# Patient Record
Sex: Male | Born: 2000 | Race: White | Hispanic: No | Marital: Single | Smoking: Never smoker
Health system: Southern US, Community
[De-identification: ages and names within clinical notes are randomized; demographics above are authoritative.]

## PROBLEM LIST (undated history)

## (undated) DIAGNOSIS — S42031A Displaced fracture of lateral end of right clavicle, initial encounter for closed fracture: Secondary | ICD-10-CM

## (undated) DIAGNOSIS — T7840XA Allergy, unspecified, initial encounter: Secondary | ICD-10-CM

## (undated) HISTORY — PX: TONSILLECTOMY: SUR1361

## (undated) HISTORY — PX: ADENOIDECTOMY: SUR15

---
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
## 2022-08-27 ENCOUNTER — Other Ambulatory Visit: Payer: Self-pay | Admitting: Physician Assistant

## 2022-08-27 DIAGNOSIS — Z1152 Encounter for screening for COVID-19: Secondary | ICD-10-CM

## 2022-08-27 LAB — POCT INFLUENZA A/B
Influenza A, POC: NEGATIVE
Influenza B, POC: NEGATIVE

## 2022-08-27 LAB — POC COVID19 BINAXNOW: SARS Coronavirus 2 Ag: NEGATIVE

## 2022-08-27 MED ORDER — PROMETHAZINE-DM 6.25-15 MG/5ML PO SYRP: 5.0000 mL | ORAL_SOLUTION | Freq: Four times a day (QID) | ORAL | 0 refills | Status: DC | PRN

## 2022-08-27 MED ORDER — IBUPROFEN 600 MG PO TABS: 600.0000 mg | ORAL_TABLET | Freq: Three times a day (TID) | ORAL | 0 refills | Status: DC | PRN

## 2022-08-27 NOTE — Progress Notes (Signed)
   Subjective: Cough and congestion    Patient ID: Eugene Elliott, male    DOB: January 05, 2001, 21 y.o.   MRN: [id number]  HPI Patient complaining of 2 days of cough, nasal congestion, and bodyaches.  Denies recent travel or known contact with COVID-19.  Patient tested negative for COVID-19 and influenza prior to evaluation.   Review of Systems Negative except for chief complaint    Objective:   Physical Exam  Deferred due to this being a telephonic encounter      Assessment & Plan: Upper respiratory infection   Advised patient is negative for COVID-19 or influenza.  Complaints consistent with upper respiratory infection.  Patient given a prescription for Phenergan DM and ibuprofen.  Vies to follow-up with no improvement or worsening of complaint.

## 2022-08-27 NOTE — Progress Notes (Signed)
Pt presents today with achy, coughing up thick mucus, fatigue, fever, chills, night sweats, & mucus is dark green 5 days. Past three night have been night sweats.

## 2022-11-07 ENCOUNTER — Ambulatory Visit: Payer: Self-pay

## 2022-11-07 DIAGNOSIS — Z0000 Encounter for general adult medical examination without abnormal findings: Secondary | ICD-10-CM

## 2022-11-07 LAB — POCT URINALYSIS DIPSTICK
Bilirubin, UA: NEGATIVE
Blood, UA: NEGATIVE
Glucose, UA: NEGATIVE
Ketones, UA: NEGATIVE
Leukocytes, UA: NEGATIVE
Nitrite, UA: NEGATIVE
Protein, UA: NEGATIVE
Spec Grav, UA: >=1.03 — AB (ref 1.010–1.025)
Urobilinogen, UA: 0.2 E.U./dL
pH, UA: 6 (ref 5.0–8.0)

## 2022-11-07 NOTE — Progress Notes (Signed)
Pt presents today for lap portion of physical./CL,RMA

## 2022-11-08 LAB — CMP12+LP+TP+TSH+6AC+CBC/D/PLT
ALT: 22 IU/L (ref 0–44)
AST: 26 IU/L (ref 0–40)
Albumin/Globulin Ratio: 2.6 — ABNORMAL HIGH (ref 1.2–2.2)
Albumin: 4.6 g/dL (ref 4.3–5.2)
Alkaline Phosphatase: 116 IU/L (ref 51–125)
BUN/Creatinine Ratio: 11 (ref 9–20)
BUN: 9 mg/dL (ref 6–20)
Basophils Absolute: 0 10*3/uL (ref 0.0–0.2)
Basos: 0 %
Bilirubin Total: 0.6 mg/dL (ref 0.0–1.2)
Calcium: 9.1 mg/dL (ref 8.7–10.2)
Chloride: 105 mmol/L (ref 96–106)
Chol/HDL Ratio: 3.7 ratio (ref 0.0–5.0)
Cholesterol, Total: 177 mg/dL — ABNORMAL HIGH (ref 100–169)
Creatinine, Ser: 0.82 mg/dL (ref 0.76–1.27)
EOS (ABSOLUTE): 0.6 10*3/uL — ABNORMAL HIGH (ref 0.0–0.4)
Eos: 12 %
Estimated CHD Risk: 0.6 times avg.
Free Thyroxine Index: 1.4 (ref 1.2–4.9)
GGT: 18 IU/L (ref 0–65)
Globulin, Total: 1.8 g/dL (ref 1.5–4.5)
Glucose: 93 mg/dL (ref 70–99)
HDL: 48 mg/dL (ref >39)
Hematocrit: 43.3 % (ref 37.5–51.0)
Hemoglobin: 14.9 g/dL (ref 13.0–17.7)
Immature Grans (Abs): 0 10*3/uL (ref 0.0–0.1)
Immature Granulocytes: 0 %
Iron: 108 ug/dL (ref 38–169)
LDH: 205 IU/L (ref 121–224)
LDL Chol Calc (NIH): 114 mg/dL — ABNORMAL HIGH (ref 0–109)
Lymphocytes Absolute: 2 10*3/uL (ref 0.7–3.1)
Lymphs: 43 %
MCH: 29 pg (ref 26.6–33.0)
MCHC: 34.4 g/dL (ref 31.5–35.7)
MCV: 84 fL (ref 79–97)
Monocytes Absolute: 0.3 10*3/uL (ref 0.1–0.9)
Monocytes: 6 %
Neutrophils Absolute: 1.8 10*3/uL (ref 1.4–7.0)
Neutrophils: 39 %
Phosphorus: 4 mg/dL (ref 3.4–5.5)
Platelets: 199 10*3/uL (ref 150–450)
Potassium: 4.1 mmol/L (ref 3.5–5.2)
RBC: 5.13 x10E6/uL (ref 4.14–5.80)
RDW: 12.3 % (ref 11.6–15.4)
Sodium: 140 mmol/L (ref 134–144)
T3 Uptake Ratio: 28 % (ref 24–39)
T4, Total: 4.9 ug/dL (ref 4.5–12.0)
TSH: 1.31 u[IU]/mL (ref 0.450–4.500)
Total Protein: 6.4 g/dL (ref 6.0–8.5)
Triglycerides: 83 mg/dL (ref 0–89)
Uric Acid: 5.4 mg/dL (ref 3.8–8.4)
VLDL Cholesterol Cal: 15 mg/dL (ref 5–40)
WBC: 4.7 10*3/uL (ref 3.4–10.8)
eGFR: 130 mL/min/{1.73_m2} (ref >59)

## 2022-11-11 ENCOUNTER — Encounter: Payer: Self-pay | Admitting: Physician Assistant

## 2022-11-11 ENCOUNTER — Ambulatory Visit: Payer: Self-pay | Admitting: Physician Assistant

## 2022-11-11 VITALS — BP 116/79 | HR 95 | Temp 97.6°F | Resp 12 | Ht 66.0 in | Wt 179.0 lb

## 2022-11-11 DIAGNOSIS — Z0000 Encounter for general adult medical examination without abnormal findings: Secondary | ICD-10-CM

## 2022-11-11 NOTE — Progress Notes (Signed)
   Established Patient Office Visit  Subjective   Patient ID: Eugene Elliott, male    DOB: 01-30-2001  Age: 22 y.o. MRN: [id number]  Chief Complaint  Patient presents with   Annual Exam    HPI    ROS    Objective:     BP 116/79   Pulse 95   Temp 97.6 F (36.4 C) (Temporal)   Resp 12   Ht 5' 6"$  (1.676 m)   Wt 179 lb (81.2 kg)   SpO2 97%   BMI 28.89 kg/m    Physical Exam   No results found for any visits on 11/11/22.    The ASCVD Risk score (Arnett DK, et al., 2019) failed to calculate for the following reasons:   The 2019 ASCVD risk score is only valid for ages 18 to 52    Assessment & Plan:   Problem List Items Addressed This Visit   None   No follow-ups on file.    Hazle Coca, RMA Pt present today to complete physical. Pt denies any issues or concerns at this time, Pt does have mother with him that has a question about the blood work. Burna Sis

## 2022-11-11 NOTE — Progress Notes (Signed)
[facility]   ____________________________________________   None    (approximate)  I have reviewed the triage vital signs and the nursing notes.   HISTORY  Chief Complaint No chief complaint on file.   HPI Eugene Elliott is a 22 y.o. male patient presents for annual physical exam.  Patient no concerns or complaints.         Past Medical History:  Diagnosis Date   Allergy    Closed fracture of acromial end of right clavicle     Patient Active Problem List   Diagnosis Date Noted   Acne vulgaris 05/05/2019   Closed displaced fracture of shaft of right clavicle 07/29/2017    Past Surgical History:  Procedure Laterality Date   ADENOIDECTOMY     HARDWARE REMOVAL Right 03/10/2018   Procedure: HARDWARE REMOVAL FROM RIGHT CLAVICLE;  Surgeon: Corky Mull, MD;  Location: [place];  Service: Orthopedics;  Laterality: Right;   ORIF CLAVICULAR FRACTURE Right 07/28/2017   Procedure: OPEN REDUCTION INTERNAL FIXATION (ORIF) CLAVICULAR FRACTURE;  Surgeon: Corky Mull, MD;  Location: [facility];  Service: Orthopedics;  Laterality: Right;  right clavicle   TONSILECTOMY, ADENOIDECTOMY, BILATERAL MYRINGOTOMY AND TUBES  2007   TONSILLECTOMY      Prior to Admission medications   Medication Sig Start Date End Date Taking? Authorizing Provider  Adapalene (DIFFERIN) 0.3 % gel Apply 1 application topically at bedtime. To face, chest and shoulders 10/22/20   Ralene Bathe, MD  ibuprofen (ADVIL) 600 MG tablet Take 1 tablet (600 mg total) by mouth every 8 (eight) hours as needed for fever, mild pain or headache. 08/27/22   Sable Feil, PA-C  promethazine-dextromethorphan (PROMETHAZINE-DM) 6.25-15 MG/5ML syrup Take 5 mLs by mouth 4 (four) times daily as needed for cough. 08/27/22   Sable Feil, PA-C    Allergies Patient has no known allergies.  No family history on file.  Social History Social History   Tobacco Use   Smoking  status: Never   Smokeless tobacco: Never  Vaping Use   Vaping Use: Never used  Substance Use Topics   Alcohol use: No    Alcohol/week: 0.0 standard drinks of alcohol   Drug use: No    Review of Systems Constitutional: No fever/chills Eyes: No visual changes. ENT: No sore throat. Cardiovascular: Denies chest pain. Respiratory: Denies shortness of breath. Gastrointestinal: No abdominal pain.  No nausea, no vomiting.  No diarrhea.  No constipation. Genitourinary: Negative for dysuria. Musculoskeletal: Negative for back pain. Skin: Negative for rash. Neurological: Negative for headaches, focal weakness or numbness.  ____________________________________________   PHYSICAL EXAM:  VITAL SIGNS: BP is 116/79, pulse 95, respiration 12, temperature 97.6, patient 97% O2 sat on room air.  Patient was on 79 pounds and BMI is 28.89. onstitutional: Alert and oriented. Well appearing and in no acute distress. Eyes: Conjunctivae are normal. PERRL. EOMI. Head: Atraumatic. Nose: No congestion/rhinnorhea. Mouth/Throat: Mucous membranes are moist.  Oropharynx non-erythematous. Neck: No stridor.  No cervical spine tenderness to palpation. Hematological/Lymphatic/Immunilogical: No cervical lymphadenopathy. Cardiovascular: Normal rate, regular rhythm. Grossly normal heart sounds.  Good peripheral circulation. Respiratory: Normal respiratory effort.  No retractions. Lungs CTAB. Gastrointestinal: Soft and nontender. No distention. No abdominal bruits. No CVA tenderness. Genitourinary: Deferred Musculoskeletal: No lower extremity tenderness nor edema.  No joint effusions. Neurologic:  Normal speech and language. No gross focal neurologic deficits are appreciated. No gait instability. Skin:  Skin is warm, dry and intact. No rash noted.  Psychiatric: Mood and affect are normal. Speech and behavior are normal.  ____________________________________________   LABS _____     Component Ref Range & Units  4 d ago  Color, UA  yellow  Clarity, UA  clear  Glucose, UA Negative Negative  Bilirubin, UA  neg  Ketones, UA  neg  Spec Grav, UA 1.010 - 1.025 >=1.030 Abnormal   Blood, UA  negative  pH, UA 5.0 - 8.0 6.0  Protein, UA Negative Negative  Urobilinogen, UA 0.2 or 1.0 E.U./dL 0.2  Nitrite, UA  neg  Leukocytes, UA Negative Negative  Appearance    Odor              Component Ref Range & Units 4 d ago  Glucose 70 - 99 mg/dL 93  Uric Acid 3.8 - 8.4 mg/dL 5.4  Comment:            Therapeutic target for gout patients: <6.0  BUN 6 - 20 mg/dL 9  Creatinine, Ser 0.76 - 1.27 mg/dL 0.82  eGFR >59 mL/min/1.73 130  BUN/Creatinine Ratio 9 - 20 11  Sodium 134 - 144 mmol/L 140  Potassium 3.5 - 5.2 mmol/L 4.1  Chloride 96 - 106 mmol/L 105  Calcium 8.7 - 10.2 mg/dL 9.1  Phosphorus 3.4 - 5.5 mg/dL 4.0  Total Protein 6.0 - 8.5 g/dL 6.4  Albumin 4.3 - 5.2 g/dL 4.6  Globulin, Total 1.5 - 4.5 g/dL 1.8  Albumin/Globulin Ratio 1.2 - 2.2 2.6 High   Bilirubin Total 0.0 - 1.2 mg/dL 0.6  Alkaline Phosphatase 51 - 125 IU/L 116  LDH 121 - 224 IU/L 205  AST 0 - 40 IU/L 26  ALT 0 - 44 IU/L 22  GGT 0 - 65 IU/L 18  Iron 38 - 169 ug/dL 108  Cholesterol, Total 100 - 169 mg/dL 177 High   Triglycerides 0 - 89 mg/dL 83  HDL >39 mg/dL 48  VLDL Cholesterol Cal 5 - 40 mg/dL 15  LDL Chol Calc (NIH) 0 - 109 mg/dL 114 High   Chol/HDL Ratio 0.0 - 5.0 ratio 3.7  Comment:                                   T. Chol/HDL Ratio                                             Men  Women                               1/2 Avg.Risk  3.4    3.3                                   Avg.Risk  5.0    4.4                                2X Avg.Risk  9.6    7.1                                3X Avg.Risk 23.4   11.0  Estimated  CHD Risk times avg. 0.6  Comment: The Estimated CHD Risk is based on adult population and may not apply to children and adolescents <20 yrs of age. The CHD Risk is based on the T. Chol/HDL ratio.  Other factors affect CHD Risk such as hypertension, smoking, diabetes, severe obesity, and family history of premature CHD.  TSH 0.450 - 4.500 uIU/mL 1.310  T4, Total 4.5 - 12.0 ug/dL 4.9  T3 Uptake Ratio 24 - 39 % 28  Free Thyroxine Index 1.2 - 4.9 1.4  WBC 3.4 - 10.8 x10E3/uL 4.7  RBC 4.14 - 5.80 x10E6/uL 5.13  Hemoglobin 13.0 - 17.7 g/dL 14.9  Hematocrit 37.5 - 51.0 % 43.3  MCV 79 - 97 fL 84  MCH 26.6 - 33.0 pg 29.0  MCHC 31.5 - 35.7 g/dL 34.4  RDW 11.6 - 15.4 % 12.3  Platelets 150 - 450 x10E3/uL 199  Neutrophils Not Estab. % 39  Lymphs Not Estab. % 43  Monocytes Not Estab. % 6  Eos Not Estab. % 12  Basos Not Estab. % 0  Neutrophils Absolute 1.4 - 7.0 x10E3/uL 1.8  Lymphocytes Absolute 0.7 - 3.1 x10E3/uL 2.0  Monocytes Absolute 0.1 - 0.9 x10E3/uL 0.3  EOS (ABSOLUTE) 0.0 - 0.4 x10E3/uL 0.6 High   Basophils Absolute 0.0 - 0.2 x10E3/uL 0.0  Immature Granulocytes Not Estab. % 0          _______________________________________   ____________________________________________    ____________________________________________   INITIAL IMPRESSION / ASSESSMENT AND PLAN As part of my medical decision making, I reviewed the following data within the electronic MEDICAL RECORD NUMBER      No acute findings on physical exam.  Patient advised to monitor diet secondary to slight elevation of cholesterol.        ____________________________________________   FINAL CLINICAL IMPRESSION  Well exam   ED Discharge Orders     None        Note:  This document was prepared using Dragon voice recognition software and may include unintentional dictation errors.

## 2022-11-13 ENCOUNTER — Encounter: Payer: 59 | Admitting: Physician Assistant

## 2022-12-20 ENCOUNTER — Ambulatory Visit
Admission: RE | Admit: 2022-12-20 | Discharge: 2022-12-20 | Disposition: A | Discharge status: Home / Self Care | Payer: 59 | Source: Ambulatory Visit | Attending: Emergency Medicine | Admitting: Emergency Medicine

## 2022-12-20 VITALS — BP 117/71 | HR 85 | Temp 97.9°F | Resp 18

## 2022-12-20 DIAGNOSIS — Z23 Encounter for immunization: Secondary | ICD-10-CM

## 2022-12-20 DIAGNOSIS — L03031 Cellulitis of right toe: Secondary | ICD-10-CM | POA: Diagnosis not present

## 2022-12-20 MED ORDER — SULFAMETHOXAZOLE-TRIMETHOPRIM 800-160 MG PO TABS: 1.0000 | ORAL_TABLET | Freq: Two times a day (BID) | ORAL | 0 refills | Status: AC

## 2022-12-20 MED ORDER — TETANUS-DIPHTH-ACELL PERTUSSIS 5-2.5-18.5 LF-MCG/0.5 IM SUSY
0.5000 mL | PREFILLED_SYRINGE | Freq: Once | INTRAMUSCULAR | Status: AC
  Administered 2022-12-20: 0.5 mL via INTRAMUSCULAR

## 2022-12-20 NOTE — Discharge Instructions (Addendum)
Keep your wound clean and dry.  Wash it gently twice a day with soap and water.  Apply an antibiotic cream twice a day.    Take the antibiotic as directed.   Follow up with a podiatrist.    [facility] [address] [place], [place] [address] Phone: [phone]

## 2022-12-20 NOTE — ED Triage Notes (Signed)
Patient to Urgent Care with complaints of right sided big toe pain caused by an ingrown toenail. Has tried two home remedies without relief.   Has been taking advil. Pain stated 2 weeks ago. Noticed some purulent drainage.

## 2022-12-20 NOTE — ED Provider Notes (Signed)
Eugene Elliott    CSN: [id number] Arrival date & time: 12/20/22  1330      History   Chief Complaint Chief Complaint  Patient presents with   Foot Pain    Ingrown toenail. Tried home remedies for 2 weeks and nothing has worked. - Entered by patient    HPI Eugene Elliott is a 22 y.o. male.  Accompanied by his mother, patient presents with pain, redness, swelling of right great toe beside toenail x 2 weeks.  It has some purulent drainage.  He has been treating it with soaks.  He denies fever, chills, numbness, weakness, or other symptoms.  Last tetanus unknown.    The history is provided by the patient, a parent and medical records.    Past Medical History:  Diagnosis Date   Allergy    Closed fracture of acromial end of right clavicle     Patient Active Problem List   Diagnosis Date Noted   Acne vulgaris 05/05/2019   Closed displaced fracture of shaft of right clavicle 07/29/2017    Past Surgical History:  Procedure Laterality Date   ADENOIDECTOMY     HARDWARE REMOVAL Right 03/10/2018   Procedure: HARDWARE REMOVAL FROM RIGHT CLAVICLE;  Surgeon: Christena Flake, MD;  Location: [facility];  Service: Orthopedics;  Laterality: Right;   ORIF CLAVICULAR FRACTURE Right 07/28/2017   Procedure: OPEN REDUCTION INTERNAL FIXATION (ORIF) CLAVICULAR FRACTURE;  Surgeon: Christena Flake, MD;  Location: [facility];  Service: Orthopedics;  Laterality: Right;  right clavicle   TONSILECTOMY, ADENOIDECTOMY, BILATERAL MYRINGOTOMY AND TUBES  2007   TONSILLECTOMY         Home Medications    Prior to Admission medications   Medication Sig Start Date End Date Taking? Authorizing Provider  sulfamethoxazole-trimethoprim (BACTRIM DS) 800-160 MG tablet Take 1 tablet by mouth 2 (two) times daily for 7 days. 12/20/22 12/27/22 Yes Mickie Bail, NP    Family History History reviewed. No pertinent family history.  Social History Social History   Tobacco Use   Smoking status: Never    Smokeless tobacco: Never  Vaping Use   Vaping Use: Never used  Substance Use Topics   Alcohol use: No    Alcohol/week: 0.0 standard drinks of alcohol   Drug use: No     Allergies   Patient has no known allergies.   Review of Systems Review of Systems  Constitutional:  Negative for chills and fever.  Musculoskeletal:  Negative for arthralgias and gait problem.  Skin:  Positive for color change and wound.  Neurological:  Negative for weakness and numbness.  All other systems reviewed and are negative.    Physical Exam Triage Vital Signs ED Triage Vitals  Enc Vitals Group     BP      Pulse      Resp      Temp      Temp src      SpO2      Weight      Height      Head Circumference      Peak Flow      Pain Score      Pain Loc      Pain Edu?      Excl. in GC?    No data found.  Updated Vital Signs BP 117/71   Pulse 85   Temp 97.9 F (36.6 C)   Resp 18   SpO2 98%   Visual Acuity Right Eye Distance:  Left Eye Distance:   Bilateral Distance:    Right Eye Near:   Left Eye Near:    Bilateral Near:     Physical Exam Constitutional:      General: He is not in acute distress.    Appearance: Normal appearance. He is not ill-appearing.  HENT:     Mouth/Throat:     Mouth: Mucous membranes are moist.  Cardiovascular:     Rate and Rhythm: Normal rate and regular rhythm.     Heart sounds: Normal heart sounds.  Pulmonary:     Effort: Pulmonary effort is normal. No respiratory distress.     Breath sounds: Normal breath sounds.  Musculoskeletal:        General: No deformity. Normal range of motion.  Skin:    General: Skin is warm and dry.     Findings: Erythema and lesion present.     Comments: Paronychia of right great toe: no drainage at this time. See picture.  Probed area with scalpel and no purulent drainage.   Neurological:     General: No focal deficit present.     Mental Status: He is alert and oriented to person, place, and time.     Sensory: No  sensory deficit.     Motor: No weakness.     Gait: Gait normal.  Psychiatric:        Mood and Affect: Mood normal.        Behavior: Behavior normal.      UC Treatments / Results  Labs (all labs ordered are listed, but only abnormal results are displayed) Labs Reviewed - No data to display  EKG   Radiology No results found.  Procedures Procedures (including critical care time)  Medications Ordered in UC Medications  Tdap (BOOSTRIX) injection 0.5 mL (0.5 mLs Intramuscular Given 12/20/22 1404)    Initial Impression / Assessment and Plan / UC Course  I have reviewed the triage vital signs and the nursing notes.  Pertinent labs & imaging results that were available during my care of the patient were reviewed by me and considered in my medical decision making (see chart for details).    Paronychia of right great toe.  No indication for I&D at this time.  Tetanus updated.  Treating with Bactrim DS.  Wound care instructions discussed.  Instructed patient to follow-up with a podiatrist.  His mother reports he has an appointment already scheduled with [facility] on 12/24/2022.  Education provided on paronychia.  Patient agrees to plan of care.  Final Clinical Impressions(s) / UC Diagnoses   Final diagnoses:  Paronychia of great toe, right     Discharge Instructions      Keep your wound clean and dry.  Wash it gently twice a day with soap and water.  Apply an antibiotic cream twice a day.    Take the antibiotic as directed.   Follow up with a podiatrist.    [facility] [address] [place], [place] [address] Phone: [phone]      ED Prescriptions     Medication Sig Dispense Auth. Provider   sulfamethoxazole-trimethoprim (BACTRIM DS) 800-160 MG tablet Take 1 tablet by mouth 2 (two) times daily for 7 days. 14 tablet Mickie Bail, NP      PDMP not reviewed this encounter.   Mickie Bail, NP 12/20/22 1407

## 2022-12-24 ENCOUNTER — Ambulatory Visit (INDEPENDENT_AMBULATORY_CARE_PROVIDER_SITE_OTHER): Payer: 59 | Admitting: Podiatry

## 2022-12-24 DIAGNOSIS — L600 Ingrowing nail: Secondary | ICD-10-CM | POA: Diagnosis not present

## 2022-12-24 NOTE — Progress Notes (Signed)
Subjective:   Patient ID: Barb Merino, male   DOB: 22 y.o.   MRN: [id number]   HPI Patient presents with significant ingrown toenail right big toe sore when pressed and make it hard to wear shoe gear comfortably.  Patient states that he is currently on antibiotic and the redness has reduced   ROS      Objective:  Physical Exam  Neurovascular status intact incurvated lateral border right hallux painful when pressed with slight redness around the area no active drainage     Assessment:  Ingrown toenail deformity right hallux lateral border     Plan:  H&P discussed condition recommended correction explained procedure risk patient wants surgery as does mom and today I went ahead and allow them to sign consent form.  I infiltrated the right hallux 60 mg like Marcaine mixture sterile prep done using sterile instrumentation remove the lateral border exposed matrix applied phenol 3 applications 30 seconds followed by alcohol by sterile dressing gave instructions on soaks leave dressing on 24 hours to properly if throbbing were to occur encouraged to call questions concerns which may arise

## 2022-12-24 NOTE — Patient Instructions (Addendum)

## 2023-03-17 DIAGNOSIS — Z0000 Encounter for general adult medical examination without abnormal findings: Secondary | ICD-10-CM | POA: Diagnosis not present

## 2024-03-22 ENCOUNTER — Ambulatory Visit: Payer: Self-pay | Admitting: Physician Assistant

## 2024-03-22 ENCOUNTER — Encounter: Payer: Self-pay | Admitting: Physician Assistant

## 2024-03-22 VITALS — BP 124/63 | HR 69 | Temp 97.1°F | Resp 16

## 2024-03-22 DIAGNOSIS — R21 Rash and other nonspecific skin eruption: Secondary | ICD-10-CM

## 2024-03-22 MED ORDER — METHYLPREDNISOLONE 4 MG PO TBPK: ORAL_TABLET | ORAL | 0 refills | Status: DC

## 2024-03-22 MED ORDER — TRIAMCINOLONE ACETONIDE 40 MG/ML IJ SUSP
40.0000 mg | Freq: Once | INTRAMUSCULAR | Status: AC
  Administered 2024-03-22: 40 mg via INTRAMUSCULAR

## 2024-03-22 MED ORDER — HYDROXYZINE PAMOATE 25 MG PO CAPS: 25.0000 mg | ORAL_CAPSULE | Freq: Three times a day (TID) | ORAL | 0 refills | Status: DC | PRN

## 2024-03-22 NOTE — Progress Notes (Signed)
 About a week ago had tape on fingers with a boxing class and has had a bumpy type raised and dry rash between fingers of both hands as well as palms and has also been doing yard work.  Not aware of any allergy or any eczema.  Noted mild non itchy redness to top of thighs and denies complaints.  Stated only other spot is on medial side of left foot and intermittently itchy and took a Claritin today.  Reports it about the same since the tape was on his fingers, but there had been no tape on his left foot.

## 2024-03-22 NOTE — Progress Notes (Signed)
   Subjective: Rash    Patient ID: Eugene Elliott, male    DOB: 2001/07/24, 23 y.o.   MRN: [id number]  HPI Patient complaining of rash bilateral hand which she believes is from a boxing class which required him to use tape.  Patient states mild itching.  Incident occurred approximately 1 week ago.  No relief with Claritin.   Review of Systems Negative except for above complaint    Objective:   Physical Exam BP 124/63  Pulse Rate 69  Temp 97.1 F (36.2 C)  Resp 16  SpO2 96 %  Papular vesicle lesions dorsal palmar aspect of bilateral hand.  Mild erythema.  No signs of secondary infection.  Neurovascular intact.  Free and equal range of motion.       Assessment & Plan: Contact dermatitis  Patient given Kenalog  40 mg's.  Advised to follow-up if no improvement in 7 to 10 days.

## 2024-06-06 ENCOUNTER — Ambulatory Visit: Payer: Self-pay | Admitting: Physician Assistant

## 2024-06-08 ENCOUNTER — Ambulatory Visit: Payer: Self-pay | Admitting: Physician Assistant

## 2024-06-08 ENCOUNTER — Encounter: Payer: Self-pay | Admitting: Physician Assistant

## 2024-06-08 VITALS — BP 114/71 | HR 82 | Temp 98.0°F | Resp 14 | Ht 66.0 in | Wt 168.0 lb

## 2024-06-08 DIAGNOSIS — L231 Allergic contact dermatitis due to adhesives: Secondary | ICD-10-CM

## 2024-06-08 MED ORDER — HYDROCORTISONE VALERATE 0.2 % EX CREA: 1.0000 | TOPICAL_CREAM | Freq: Two times a day (BID) | CUTANEOUS | 0 refills | Status: AC

## 2024-06-08 MED ORDER — HYDROXYZINE PAMOATE 25 MG PO CAPS: 25.0000 mg | ORAL_CAPSULE | Freq: Three times a day (TID) | ORAL | 0 refills | Status: AC | PRN

## 2024-06-08 NOTE — Progress Notes (Signed)
 Pt presents today for follow up from July with rash on hands. Pt states he's frustrated with his hands. One minute he feels like its healing then his skin goes back to being raw dry and cracking since rash started.

## 2024-06-08 NOTE — Progress Notes (Signed)
   Subjective:Rash    Patient ID: Eugene Elliott, male    DOB: Jul 11, 2001, 23 y.o.   MRN: [id number]  HPI Patient is following up 2 months status post a rash which  believe was from having his fingers and hand take for boxing class.  Patient was seen on 03/22/2024 and was given a prescription for Medrol  Dosepak and Atarax .  Patient was also given 40 mg of Kenalog  in clinic.  Patient was told to follow-up in 1 week.  Patient did not return until today.  Patient states rash waxes and wanes.  Mild itching.  No provocative measures since last visit. Review of Systems     Objective:   Physical Exam BP 114/71  Cuff Size Normal  Pulse Rate 82  Temp 98 F (36.7 C)  Weight 168 lb (76.2 kg)  Height 5' 6 (1.676 m)  Resp 14  SpO2 99 %     Patient presents with dry scaly lesion bilateral left hand.  Mild edema.  No signs symptoms secondary infection.  Neurovasc intact.  Free and equal range of motion.    Assessment & Plan: Contact dermatitis versus fungal infection.  Patient given prescription for Westcort  to apply twice a day.  Patient given prescription for Atarax .  Advised patient the importance of following up in 1 week..  If no improvement will consider consult to dermatology.

## 2024-06-15 ENCOUNTER — Ambulatory Visit: Payer: Self-pay | Admitting: Physician Assistant

## 2024-06-22 ENCOUNTER — Encounter: Payer: Self-pay | Admitting: Physician Assistant

## 2024-06-22 ENCOUNTER — Ambulatory Visit: Payer: Self-pay | Admitting: Physician Assistant

## 2024-06-22 DIAGNOSIS — L231 Allergic contact dermatitis due to adhesives: Secondary | ICD-10-CM

## 2024-06-22 NOTE — Progress Notes (Signed)
 Pt presents today for follow up with rash on hands. Pt didn't voice any concerns at this time. Eugene Elliott

## 2024-06-22 NOTE — Progress Notes (Signed)
   Subjective:Rash    Patient ID: Eugene Elliott, male    DOB: [birth year], 23 y.o.   MRN: [id number]  HPI Patient presents for follow-up secondary to contact dermatitis.  Patient was seen on 06/08/2024 and given a prescription for Westcort  and Atarax .  States rash is slowly resolving.  Itching has resolved.   Review of Systems Negative except for above complaint    Objective:   Physical Exam Receding erythema finger webspace of both hands.  No signs symptoms secondary infection.       Assessment & Plan: Contact dermatitis   Continue previous medications and follow-up if condition worsens.

## 2024-08-15 ENCOUNTER — Encounter: Payer: Self-pay | Admitting: Dermatology

## 2024-08-15 ENCOUNTER — Ambulatory Visit: Admitting: Dermatology

## 2024-08-15 DIAGNOSIS — L2389 Allergic contact dermatitis due to other agents: Secondary | ICD-10-CM | POA: Diagnosis not present

## 2024-08-15 DIAGNOSIS — L302 Cutaneous autosensitization: Secondary | ICD-10-CM

## 2024-08-15 MED ORDER — TRIAMCINOLONE ACETONIDE 0.1 % EX CREA: TOPICAL_CREAM | CUTANEOUS | 5 refills | Status: AC

## 2024-08-15 MED ORDER — CLOBETASOL PROPIONATE 0.05 % EX OINT: TOPICAL_OINTMENT | CUTANEOUS | 5 refills | Status: AC

## 2024-08-15 NOTE — Progress Notes (Signed)
   New Patient Visit   Subjective  Eugene Elliott is a 23 y.o. male who presents for the following: Rash in the bilateral axilla and bilateral hands. Patient is currently using OTC cream (unsure of name) and lidocaine  spray. Patient denied recent changes in deodorants. Patient denied rash in the groin. Patient states rash on the hand started after a boxing class where his hands were wrapped.     The following portions of the chart were reviewed this encounter and updated as appropriate: medications, allergies, medical history  Review of Systems:  No other skin or systemic complaints except as noted in HPI or Assessment and Plan.  Objective  Well appearing patient in no apparent distress; mood and affect are within normal limits.   A focused examination was performed of the following areas: Bilateral upper extremities and axilla   Relevant exam findings are noted in the Assessment and Plan.    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 Broad scaly pink papules coalescing to plaques on bilateral axillae and abdominal folds 5% BSA  Chronic and persistent condition with duration or expected duration over one year. Condition is symptomatic/ bothersome to patient. Not currently at goal.  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 Should not recur if allergen is avoided.  Treatment Plan: - Start Clobetasol 0.05% ointment apply topically twice daily to affected areas of the hands - Start Triamcinolone  0.1% cream apply topically twice daily to affected areas of the axilla and abdomen - avoid picking if possible Topical steroids (such as triamcinolone , fluocinolone, fluocinonide, mometasone, clobetasol, halobetasol, betamethasone, hydrocortisone ) can cause thinning and lightening of the skin if they are used for  too long in the same area. Your physician has selected the right strength medicine for your problem and area affected on the body. Please use your medication only as directed by your physician to prevent side effects.   Recommend gentle skin care.    ALLERGIC CONTACT DERMATITIS DUE TO OTHER AGENTS   AUTOECZEMATIZATION    Return in about 3 weeks (around 09/05/2024) for Atopic Derm.  I, Emerick Ege, CMA am acting as scribe for Boneta Sharps, MD.   Documentation: I have reviewed the above documentation for accuracy and completeness, and I agree with the above.  Boneta Sharps, MD

## 2024-08-15 NOTE — Patient Instructions (Addendum)
 - Start Clobetasol 0.05% ointment apply topically twice daily to affected areas of the hands - Start Triamcinolone  0.1% cream apply topically twice daily to affected areas of the axilla and abdomen  Topical steroids (such as triamcinolone , fluocinolone, fluocinonide, mometasone, clobetasol, halobetasol, betamethasone, hydrocortisone ) can cause thinning and lightening of the skin if they are used for too long in the same area. Your physician has selected the right strength medicine for your problem and area affected on the body. Please use your medication only as directed by your physician to prevent side effects.    Gentle Skin Care Guide  1. Bathe no more than once a day.  2. Avoid bathing in hot water  3. Use a mild soap like Dove, Vanicream, Cetaphil, CeraVe. Can use Lever 2000 or Cetaphil antibacterial soap  4. Use soap only where you need it. On most days, use it under your arms, between your legs, and on your feet. Let the water rinse other areas unless visibly dirty.  5. When you get out of the bath/shower, use a towel to gently blot your skin dry, don't rub it.  6. While your skin is still a little damp, apply a moisturizing cream such as Vanicream, CeraVe, Cetaphil, Eucerin, Sarna lotion or plain Vaseline Jelly. For hands apply Neutrogena Norwegian Hand Cream or Excipial Hand Cream.  7. Reapply moisturizer any time you start to itch or feel dry.  8. Sometimes using free and clear laundry detergents can be helpful. Fabric softener sheets should be avoided. Downy Free & Gentle liquid, or any liquid fabric softener that is free of dyes and perfumes, it acceptable to use  9. If your doctor has given you prescription creams you may apply moisturizers over them      Due to recent changes in healthcare laws, you may see results of your pathology and/or laboratory studies on MyChart before the doctors have had a chance to review them. We understand that in some cases there may be results  that are confusing or concerning to you. Please understand that not all results are received at the same time and often the doctors may need to interpret multiple results in order to provide you with the best plan of care or course of treatment. Therefore, we ask that you please give us  2 business days to thoroughly review all your results before contacting the office for clarification. Should we see a critical lab result, you will be contacted sooner.   If You Need Anything After Your Visit  If you have any questions or concerns for your doctor, please call our main line at [phone] and press option 4 to reach your doctor's medical assistant. If no one answers, please leave a voicemail as directed and we will return your call as soon as possible. Messages left after 4 pm will be answered the following business day.   You may also send us  a message via MyChart. We typically respond to MyChart messages within 1-2 business days.  For prescription refills, please ask your pharmacy to contact our office. Our fax number is [phone].  If you have an urgent issue when the clinic is closed that cannot wait until the next business day, you can page your doctor at the number below.    Please note that while we do our best to be available for urgent issues outside of office hours, we are not available 24/7.   If you have an urgent issue and are unable to reach us , you may choose to  seek medical care at your doctor's office, retail clinic, urgent care center, or emergency room.  If you have a medical emergency, please immediately call 911 or go to the emergency department.  Pager Numbers  - Dr. Hester: [phone]  - Dr. Jackquline: [phone]  - Dr. Claudene: [phone]   - Dr. Raymund: [phone]  In the event of inclement weather, please call our main line at [phone] for an update on the status of any delays or closures.  Dermatology Medication Tips: Please keep the boxes that  topical medications come in in order to help keep track of the instructions about where and how to use these. Pharmacies typically print the medication instructions only on the boxes and not directly on the medication tubes.   If your medication is too expensive, please contact our office at [phone] option 4 or send us  a message through MyChart.   We are unable to tell what your co-pay for medications will be in advance as this is different depending on your insurance coverage. However, we may be able to find a substitute medication at lower cost or fill out paperwork to get insurance to cover a needed medication.   If a prior authorization is required to get your medication covered by your insurance company, please allow us  1-2 business days to complete this process.  Drug prices often vary depending on where the prescription is filled and some pharmacies may offer cheaper prices.  The website www.goodrx.com contains coupons for medications through different pharmacies. The prices here do not account for what the cost may be with help from insurance (it may be cheaper with your insurance), but the website can give you the price if you did not use any insurance.  - You can print the associated coupon and take it with your prescription to the pharmacy.  - You may also stop by our office during regular business hours and pick up a GoodRx coupon card.  - If you need your prescription sent electronically to a different pharmacy, notify our office through [facility] or by phone at [phone] option 4.     Si Usted Necesita Algo Despus de Su Visita  Tambin puede enviarnos un mensaje a travs de Clinical Cytogeneticist. Por lo general respondemos a los mensajes de MyChart en el transcurso de 1 a 2 das hbiles.  Para renovar recetas, por favor pida a su farmacia que se ponga en contacto con nuestra oficina. Randi lakes de fax es [place] [phone].  Si tiene un asunto urgente cuando la clnica  est cerrada y que no puede esperar hasta el siguiente da hbil, puede llamar/localizar a su doctor(a) al nmero que aparece a continuacin.   Por favor, tenga en cuenta que aunque hacemos todo lo posible para estar disponibles para asuntos urgentes fuera del horario de [place], no estamos disponibles las 24 horas del da, los 7 [address]  [address] de la [place].   Si tiene un problema urgente y no puede comunicarse con nosotros, puede optar por buscar atencin mdica  en el consultorio de su doctor(a), en una clnica privada, en un centro de atencin urgente o en una sala de emergencias.  Si tiene engineer, drilling, por favor llame inmediatamente al 911 o vaya a la sala de emergencias.  Nmeros de bper  - Dr. Hester: [phone]  - Dra. Jackquline: [phone]  - Dr. Claudene: [phone]  - Dra. Kitts: [phone]  En caso de inclemencias del [place], por favor llame a nuestra lnea principal al [phone] para ignacia actualizacin  sobre el estado de cualquier retraso o cierre.  Consejos para la medicacin en dermatologa: Por favor, guarde las cajas en las que vienen los medicamentos de uso tpico para ayudarle a seguir las instrucciones sobre dnde y cmo usarlos. Las farmacias generalmente imprimen las instrucciones del medicamento slo en las cajas y no directamente en los tubos del [place].   Si su medicamento es muy caro, por favor, pngase en contacto con landry rieger llamando al [phone] y presione la opcin 4 o envenos un mensaje a travs de Clinical Cytogeneticist.   No podemos decirle cul ser su copago por los medicamentos por adelantado ya que esto es diferente dependiendo de la cobertura de su seguro. Sin embargo, es posible que podamos encontrar un medicamento sustituto a audiological scientist un formulario para que el seguro cubra el medicamento que se considera necesario.   Si se requiere una autorizacin previa para que su compaa de seguros cubra su medicamento, por favor permtanos  de 1 a 2 das hbiles para completar este proceso.  Los precios de los medicamentos varan con frecuencia dependiendo del environmental consultant de dnde se surte la receta y alguna farmacias pueden ofrecer precios ms baratos.  El sitio web www.goodrx.com tiene cupones para medicamentos de health and safety inspector. Los precios aqu no tienen en cuenta lo que podra costar con la ayuda del seguro (puede ser ms barato con su seguro), pero el sitio web puede darle el precio si no utiliz tourist information centre manager.  - Puede imprimir el cupn correspondiente y llevarlo con su receta a la farmacia.  - Tambin puede pasar por nuestra oficina durante el horario de atencin regular y education officer, museum una tarjeta de cupones de GoodRx.  - Si necesita que su receta se enve electrnicamente a una farmacia diferente, informe a nuestra oficina a travs de MyChart de [place] o por telfono llamando al [phone] y presione la opcin 4.

## 2024-08-17 ENCOUNTER — Ambulatory Visit: Payer: Self-pay | Admitting: Physician Assistant

## 2024-08-17 ENCOUNTER — Encounter: Payer: Self-pay | Admitting: Physician Assistant

## 2024-08-17 VITALS — BP 138/74 | HR 93 | Resp 14 | Ht 66.0 in | Wt 165.0 lb

## 2024-08-17 DIAGNOSIS — J0100 Acute maxillary sinusitis, unspecified: Secondary | ICD-10-CM

## 2024-08-17 DIAGNOSIS — J020 Streptococcal pharyngitis: Secondary | ICD-10-CM

## 2024-08-17 LAB — POCT INFLUENZA A/B
Influenza A, POC: NEGATIVE
Influenza B, POC: NEGATIVE

## 2024-08-17 LAB — POCT RAPID STREP A (OFFICE): Rapid Strep A Screen: NEGATIVE

## 2024-08-17 LAB — POC COVID19 BINAXNOW: SARS Coronavirus 2 Ag: NEGATIVE

## 2024-08-17 MED ORDER — AMOXICILLIN 875 MG PO TABS: 875.0000 mg | ORAL_TABLET | Freq: Two times a day (BID) | ORAL | 0 refills | Status: AC

## 2024-08-17 MED ORDER — METHYLPREDNISOLONE 4 MG PO TBPK: ORAL_TABLET | ORAL | 0 refills | Status: AC

## 2024-08-17 MED ORDER — FEXOFENADINE-PSEUDOEPHED ER 60-120 MG PO TB12: 1.0000 | ORAL_TABLET | Freq: Two times a day (BID) | ORAL | 0 refills | Status: AC

## 2024-08-17 NOTE — Progress Notes (Signed)
   Subjective:Sore Throat    Patient ID: Eugene Elliott, male    DOB: 03-28-2001, 23 y.o.   MRN: [id number]  HPI  Patient complains of 3 days of increasing nasal congestion, postnasal drainage, and sore throat.  Onset of complaint about 1 week.  Denies fever/chills.  No recent travel or known contact with COVID-19.  Review of Systems Negative except for above complaint    Objective:   Physical Exam See nurses notes vital signs HEENT is remarkable for edematous nasal turbinates, copious postnasal drainage, and erythematous posterior pharynx. Neck is supple for lymphadenopathy. Lungs clear to auscultation. Heart regular rate and rhythm. Patient was negative for COVID-19, influenza, and strep pharyngitis.      Assessment & Plan: Subacute maxillary sinusitis   Patient given Solu-Medrol  40 mg IM followed by prescription for amoxicillin, and Allegra-D.  Advised to follow-up if no improvement in 48 hours.

## 2024-08-17 NOTE — Progress Notes (Signed)
 Pt presents today with sore throat thick mucus draining for 3 days while feeling feverish/cold chills.

## 2024-09-05 ENCOUNTER — Ambulatory Visit: Admitting: Dermatology
